# Patient Record
Sex: Female | Born: 1960 | Race: Black or African American | Hispanic: No | Marital: Married | Smoking: Former smoker
Health system: Southern US, Community
[De-identification: ages and names within clinical notes are randomized; demographics above are authoritative.]

## PROBLEM LIST (undated history)

## (undated) DIAGNOSIS — Z789 Other specified health status: Secondary | ICD-10-CM

## (undated) DIAGNOSIS — I10 Essential (primary) hypertension: Secondary | ICD-10-CM

## (undated) DIAGNOSIS — E559 Vitamin D deficiency, unspecified: Secondary | ICD-10-CM

## (undated) HISTORY — PX: MOUTH SURGERY: SHX715

## (undated) HISTORY — DX: Vitamin D deficiency, unspecified: E55.9

## (undated) HISTORY — DX: Essential (primary) hypertension: I10

---

## 2001-05-10 ENCOUNTER — Emergency Department (HOSPITAL_COMMUNITY): Admission: EM | Admit: 2001-05-10 | Discharge: 2001-05-10 | Payer: Self-pay | Admitting: *Deleted

## 2002-08-26 ENCOUNTER — Encounter: Payer: Self-pay | Admitting: Emergency Medicine

## 2002-08-26 ENCOUNTER — Emergency Department (HOSPITAL_COMMUNITY): Admission: EM | Admit: 2002-08-26 | Discharge: 2002-08-26 | Payer: Self-pay | Admitting: Emergency Medicine

## 2003-10-04 ENCOUNTER — Emergency Department (HOSPITAL_COMMUNITY): Admission: EM | Admit: 2003-10-04 | Discharge: 2003-10-05 | Payer: Self-pay | Admitting: *Deleted

## 2004-10-22 ENCOUNTER — Emergency Department (HOSPITAL_COMMUNITY): Admission: EM | Admit: 2004-10-22 | Discharge: 2004-10-22 | Payer: Self-pay | Admitting: Emergency Medicine

## 2006-01-28 ENCOUNTER — Emergency Department (HOSPITAL_COMMUNITY): Admission: EM | Admit: 2006-01-28 | Discharge: 2006-01-28 | Payer: Self-pay | Admitting: Family Medicine

## 2007-10-02 ENCOUNTER — Emergency Department (HOSPITAL_COMMUNITY): Admission: EM | Admit: 2007-10-02 | Discharge: 2007-10-02 | Payer: Self-pay | Admitting: Emergency Medicine

## 2009-03-24 ENCOUNTER — Emergency Department (HOSPITAL_COMMUNITY): Admission: EM | Admit: 2009-03-24 | Discharge: 2009-03-24 | Payer: Self-pay | Admitting: Family Medicine

## 2009-03-27 ENCOUNTER — Emergency Department (HOSPITAL_COMMUNITY): Admission: EM | Admit: 2009-03-27 | Discharge: 2009-03-27 | Payer: Self-pay | Admitting: Emergency Medicine

## 2010-10-08 LAB — URINALYSIS, ROUTINE W REFLEX MICROSCOPIC
Bilirubin Urine: NEGATIVE
Glucose, UA: NEGATIVE
Hgb urine dipstick: NEGATIVE
Protein, ur: NEGATIVE
Specific Gravity, Urine: 1.014
Urobilinogen, UA: 1
pH: 6.5

## 2010-10-08 LAB — WET PREP, GENITAL
Trich, Wet Prep: NONE SEEN
Yeast Wet Prep HPF POC: NONE SEEN

## 2010-10-08 LAB — PREGNANCY, URINE: Preg Test, Ur: NEGATIVE

## 2011-06-09 ENCOUNTER — Inpatient Hospital Stay (HOSPITAL_COMMUNITY)
Admission: AD | Admit: 2011-06-09 | Discharge: 2011-06-09 | Disposition: A | Discharge status: Home / Self Care | Payer: Self-pay | Source: Ambulatory Visit | Attending: Obstetrics and Gynecology | Admitting: Obstetrics and Gynecology

## 2011-06-09 ENCOUNTER — Encounter (HOSPITAL_COMMUNITY): Payer: Self-pay | Admitting: *Deleted

## 2011-06-09 DIAGNOSIS — N61 Mastitis without abscess: Secondary | ICD-10-CM | POA: Insufficient documentation

## 2011-06-09 DIAGNOSIS — IMO0002 Reserved for concepts with insufficient information to code with codable children: Secondary | ICD-10-CM

## 2011-06-09 DIAGNOSIS — L02419 Cutaneous abscess of limb, unspecified: Secondary | ICD-10-CM

## 2011-06-09 HISTORY — DX: Other specified health status: Z78.9

## 2011-06-09 MED ORDER — SULFAMETHOXAZOLE-TRIMETHOPRIM 800-160 MG PO TABS: 2.0000 | ORAL_TABLET | Freq: Two times a day (BID) | ORAL | Status: AC

## 2011-06-09 NOTE — MAU Provider Note (Signed)
  History     CSN: [id number]  Arrival date and time: 06/09/11 1348   First Provider Initiated Contact with Patient 06/09/11 1519      Chief Complaint  Patient presents with  . Breast Mass   HPI This is a 51 year old female who presents the MAU with a right axillary painful mass that started approximately 5 days ago. The patient noticed there is small lesion that grew in size and in pain over the past 3 or 4 days.  She has tried warm compresses which have been minimally helpful with her discomfort.  There has been no draining. She denies fevers, chills, nausea, vomiting. No provoking factors. She has had an episode of this about 10 years ago. She has not had any further episodes to  OB History    Grav Para Term Preterm Abortions TAB SAB Ect Mult Living   3 2 2  1  1   2       Past Medical History  Diagnosis Date  . No pertinent past medical history     Past Surgical History  Procedure Date  . Mouth surgery     Family History  Problem Relation Age of Onset  . Anesthesia problems Neg Hx   . Hypotension Neg Hx   . Malignant hyperthermia Neg Hx   . Pseudochol deficiency Neg Hx     History  Substance Use Topics  . Smoking status: Former Games developer  . Smokeless tobacco: Not on file  . Alcohol Use: No    Allergies: No Known Allergies  Prescriptions prior to admission  Medication Sig Dispense Refill  . albuterol (PROVENTIL HFA;VENTOLIN HFA) 108 (90 BASE) MCG/ACT inhaler Inhale 1 puff into the lungs every 6 (six) hours as needed. SOB        ROS Physical Exam   Blood pressure 148/81, pulse 117, temperature 98.3 F (36.8 C), temperature source Oral, resp. rate 18, height 5\' 3"  (1.6 m), weight 74.844 kg (165 lb).  Physical Exam  Constitutional: She is oriented to person, place, and time. She appears well-developed and well-nourished.  Neurological: She is alert and oriented to person, place, and time.  Skin: Skin is warm and dry.       Right axillary fluctuant abscess  approximately 5 cm in size.  Psychiatric: She has a normal mood and affect. Her behavior is normal. Judgment and thought content normal.    MAU Course  Procedures Incision and drainage of abscess. See note below.   Assessment and Plan  #1 right axillary abscess and cellulitis  Abscess drained as below. Place the patient on Bactrim with MRSA coverage. Culture sent. Instructions given to patient to slowly remove the packing. Patient to return to MAU in approximately 5 days as patient has no primary physician. Instructions given to patient to return if develops fevers, chills or area a significant drainage.   Procedure note:  Lidocaine 1% 10 ml was injected into the skin for local anesthetic. The flexure abscess was then incised and a large amount of purulent discharge was expressed from the abscess. Hemostats were used to break up loculations. Culture was obtained and sent to the lab. Ultimately 15 cm of half-inch packing ribbon was inserted into the abscess and the wound was covered using a pressure dressing.   Carri Spillers JEHIEL 06/09/2011, 4:10 PM

## 2011-06-09 NOTE — MAU Note (Signed)
C/o lump in R armpit for past 2 days;

## 2011-06-09 NOTE — Discharge Instructions (Signed)
Abscess An abscess (boil or furuncle) is an infected area under your skin. This area is filled with yellowish white fluid (pus). HOME CARE   Only take medicine as told by your doctor.   Keep the skin clean around your abscess. Keep clothes that may touch the abscess clean.   Change any bandages (dressings) as told by your doctor.   Avoid direct skin contact with other people. The infection can spread by skin contact with others.   Practice good hygiene and do not share personal care items.   Do not share athletic equipment, towels, or whirlpools. Shower after every practice or work out session.   If a draining area cannot be covered:   Do not play sports.   Children should not go to daycare until the wound has healed or until fluid (drainage) stops coming out of the wound.   See your doctor for a follow-up visit as told.  GET HELP RIGHT AWAY IF:   There is more pain, puffiness (swelling), and redness in the wound site.   There is fluid or bleeding from the wound site.   You have muscle aches, chills, fever, or feel sick.   You or your child has a temperature by mouth above 102 F (38.9 C), not controlled by medicine.   Your baby is older than 3 months with a rectal temperature of 102 F (38.9 C) or higher.  MAKE SURE YOU:   Understand these instructions.   Will watch your condition.   Will get help right away if you are not doing well or get worse.  Document Released: 06/12/2007 Document Revised: 12/13/2010 Document Reviewed: 06/12/2007 ExitCare Patient Information 2012 [employer]. 

## 2011-06-12 LAB — CULTURE, ROUTINE-ABSCESS: Special Requests: NORMAL

## 2011-09-23 ENCOUNTER — Ambulatory Visit: Payer: Self-pay | Admitting: Emergency Medicine

## 2012-08-25 ENCOUNTER — Other Ambulatory Visit: Payer: Self-pay

## 2012-08-25 DIAGNOSIS — Z1231 Encounter for screening mammogram for malignant neoplasm of breast: Secondary | ICD-10-CM

## 2012-09-21 ENCOUNTER — Ambulatory Visit
Admission: RE | Admit: 2012-09-21 | Discharge: 2012-09-21 | Disposition: A | Discharge status: Home / Self Care | Payer: 59 | Source: Ambulatory Visit

## 2012-09-21 DIAGNOSIS — Z1231 Encounter for screening mammogram for malignant neoplasm of breast: Secondary | ICD-10-CM

## 2012-09-23 ENCOUNTER — Other Ambulatory Visit: Payer: Self-pay | Admitting: Family

## 2012-09-23 DIAGNOSIS — R928 Other abnormal and inconclusive findings on diagnostic imaging of breast: Secondary | ICD-10-CM

## 2012-10-08 ENCOUNTER — Ambulatory Visit
Admission: RE | Admit: 2012-10-08 | Discharge: 2012-10-08 | Disposition: A | Discharge status: Home / Self Care | Payer: 59 | Source: Ambulatory Visit | Attending: Family | Admitting: Family

## 2012-10-08 DIAGNOSIS — R928 Other abnormal and inconclusive findings on diagnostic imaging of breast: Secondary | ICD-10-CM

## 2013-11-08 ENCOUNTER — Encounter (HOSPITAL_COMMUNITY): Payer: Self-pay | Admitting: *Deleted

## 2014-06-03 ENCOUNTER — Ambulatory Visit
Admission: RE | Admit: 2014-06-03 | Discharge: 2014-06-03 | Disposition: A | Discharge status: Home / Self Care | Payer: 59 | Source: Ambulatory Visit | Attending: Family | Admitting: Family

## 2014-06-03 ENCOUNTER — Other Ambulatory Visit: Payer: Self-pay | Admitting: Family

## 2014-06-03 DIAGNOSIS — M25532 Pain in left wrist: Secondary | ICD-10-CM

## 2015-03-21 DIAGNOSIS — M65341 Trigger finger, right ring finger: Secondary | ICD-10-CM | POA: Diagnosis not present

## 2015-03-21 DIAGNOSIS — M65342 Trigger finger, left ring finger: Secondary | ICD-10-CM | POA: Diagnosis not present

## 2015-03-21 DIAGNOSIS — M654 Radial styloid tenosynovitis [de Quervain]: Secondary | ICD-10-CM | POA: Diagnosis not present

## 2015-04-28 ENCOUNTER — Other Ambulatory Visit: Payer: Self-pay | Admitting: Orthopedic Surgery

## 2015-06-30 ENCOUNTER — Encounter (HOSPITAL_BASED_OUTPATIENT_CLINIC_OR_DEPARTMENT_OTHER): Payer: Self-pay

## 2015-06-30 ENCOUNTER — Ambulatory Visit (HOSPITAL_BASED_OUTPATIENT_CLINIC_OR_DEPARTMENT_OTHER): Admit: 2015-06-30 | Payer: 59 | Admitting: Orthopedic Surgery

## 2015-06-30 SURGERY — MINOR RELEASE TRIGGER FINGER/A-1 PULLEY
Anesthesia: LOCAL | Laterality: Bilateral

## 2015-09-20 DIAGNOSIS — Z79899 Other long term (current) drug therapy: Secondary | ICD-10-CM | POA: Diagnosis not present

## 2015-09-20 DIAGNOSIS — M25579 Pain in unspecified ankle and joints of unspecified foot: Secondary | ICD-10-CM | POA: Diagnosis not present

## 2015-09-20 DIAGNOSIS — M180 Bilateral primary osteoarthritis of first carpometacarpal joints: Secondary | ICD-10-CM | POA: Diagnosis not present

## 2015-09-20 DIAGNOSIS — M25571 Pain in right ankle and joints of right foot: Secondary | ICD-10-CM | POA: Diagnosis not present

## 2016-08-15 ENCOUNTER — Other Ambulatory Visit: Payer: Self-pay | Admitting: Family

## 2016-08-15 DIAGNOSIS — Z1231 Encounter for screening mammogram for malignant neoplasm of breast: Secondary | ICD-10-CM

## 2016-08-20 ENCOUNTER — Ambulatory Visit
Admission: RE | Admit: 2016-08-20 | Discharge: 2016-08-20 | Disposition: A | Discharge status: Home / Self Care | Payer: 59 | Source: Ambulatory Visit | Attending: Family | Admitting: Family

## 2016-08-20 DIAGNOSIS — Z1231 Encounter for screening mammogram for malignant neoplasm of breast: Secondary | ICD-10-CM

## 2016-09-04 DIAGNOSIS — R202 Paresthesia of skin: Secondary | ICD-10-CM | POA: Diagnosis not present

## 2016-09-04 DIAGNOSIS — Z79899 Other long term (current) drug therapy: Secondary | ICD-10-CM | POA: Diagnosis not present

## 2016-09-04 DIAGNOSIS — M180 Bilateral primary osteoarthritis of first carpometacarpal joints: Secondary | ICD-10-CM | POA: Diagnosis not present

## 2016-09-04 DIAGNOSIS — L03031 Cellulitis of right toe: Secondary | ICD-10-CM | POA: Diagnosis not present

## 2017-03-13 DIAGNOSIS — E559 Vitamin D deficiency, unspecified: Secondary | ICD-10-CM | POA: Diagnosis not present

## 2017-03-13 DIAGNOSIS — Z0000 Encounter for general adult medical examination without abnormal findings: Secondary | ICD-10-CM | POA: Diagnosis not present

## 2017-03-13 DIAGNOSIS — M06332 Rheumatoid nodule, left wrist: Secondary | ICD-10-CM | POA: Diagnosis not present

## 2018-02-23 ENCOUNTER — Other Ambulatory Visit: Payer: Self-pay | Admitting: Family

## 2018-02-23 DIAGNOSIS — Z1231 Encounter for screening mammogram for malignant neoplasm of breast: Secondary | ICD-10-CM

## 2018-03-23 ENCOUNTER — Other Ambulatory Visit: Payer: Self-pay

## 2018-03-23 ENCOUNTER — Ambulatory Visit
Admission: RE | Admit: 2018-03-23 | Discharge: 2018-03-23 | Disposition: A | Discharge status: Home / Self Care | Payer: BC Managed Care – PPO | Source: Ambulatory Visit | Attending: Family | Admitting: Family

## 2018-03-23 DIAGNOSIS — Z1231 Encounter for screening mammogram for malignant neoplasm of breast: Secondary | ICD-10-CM

## 2019-03-19 ENCOUNTER — Other Ambulatory Visit: Payer: Self-pay | Admitting: Family

## 2019-03-19 DIAGNOSIS — Z1231 Encounter for screening mammogram for malignant neoplasm of breast: Secondary | ICD-10-CM

## 2019-04-06 ENCOUNTER — Ambulatory Visit
Admission: RE | Admit: 2019-04-06 | Discharge: 2019-04-06 | Disposition: A | Discharge status: Home / Self Care | Payer: BC Managed Care – PPO | Source: Ambulatory Visit | Attending: Family | Admitting: Family

## 2019-04-06 ENCOUNTER — Other Ambulatory Visit: Payer: Self-pay

## 2019-04-06 DIAGNOSIS — Z1231 Encounter for screening mammogram for malignant neoplasm of breast: Secondary | ICD-10-CM

## 2019-04-19 ENCOUNTER — Encounter: Payer: Self-pay | Admitting: Obstetrics and Gynecology

## 2019-04-26 ENCOUNTER — Other Ambulatory Visit: Payer: Self-pay

## 2019-04-27 ENCOUNTER — Encounter: Payer: Self-pay | Admitting: Obstetrics and Gynecology

## 2019-04-27 ENCOUNTER — Ambulatory Visit: Payer: BC Managed Care – PPO | Admitting: Obstetrics and Gynecology

## 2019-04-27 VITALS — BP 130/64 | HR 97 | Temp 97.5°F | Ht 63.0 in | Wt 178.0 lb

## 2019-04-27 DIAGNOSIS — Z23 Encounter for immunization: Secondary | ICD-10-CM | POA: Diagnosis not present

## 2019-04-27 DIAGNOSIS — Z01419 Encounter for gynecological examination (general) (routine) without abnormal findings: Secondary | ICD-10-CM | POA: Diagnosis not present

## 2019-04-27 DIAGNOSIS — Z124 Encounter for screening for malignant neoplasm of cervix: Secondary | ICD-10-CM

## 2019-04-27 DIAGNOSIS — Z0000 Encounter for general adult medical examination without abnormal findings: Secondary | ICD-10-CM | POA: Diagnosis not present

## 2019-04-27 DIAGNOSIS — E559 Vitamin D deficiency, unspecified: Secondary | ICD-10-CM | POA: Diagnosis not present

## 2019-04-27 DIAGNOSIS — Z6831 Body mass index (BMI) 31.0-31.9, adult: Secondary | ICD-10-CM

## 2019-04-27 DIAGNOSIS — Z833 Family history of diabetes mellitus: Secondary | ICD-10-CM

## 2019-04-27 NOTE — Progress Notes (Signed)
59 y.o. [id number] Married Black or [place] American Not Hispanic or Latino female here for annual exam and to establish care. No vaginal bleeding. Sexually active, occasional deep dyspareunia.     Patient's last menstrual period was 09/04/2012.          Sexually active: Yes.    The current method of family planning is post menopausal status.    Exercising: Yes.    Walking and Cardio  Smoker:  no  Health Maintenance: Pap:  2019 normal  History of abnormal Pap:  no MMG:  04/06/19 Bi-rads 1 neg  BMD:   Na  Colonoscopy: about 5 years ago and is due for one this year, Dr Loreta Ave  TDaP:  Unsure  Gardasil: no    reports that she has quit smoking. She does not have any smokeless tobacco history on file. She reports that she does not drink alcohol or use drugs.Rare ETOH. 2 grown daughters, local, 5 grandchildren (oldest 38, youngest is 6). 3 sisters and 2 brothers. She works in Stage manager at [employer].   Past Medical History:  Diagnosis Date  . No pertinent past medical history   . Vitamin D deficiency     Past Surgical History:  Procedure Laterality Date  . MOUTH SURGERY      No current outpatient medications on file.   No current facility-administered medications for this visit.    Family History  Problem Relation Age of Onset  . Diabetes Mother   . Heart failure Mother   . Hypertension Mother   . Asthma Sister   . Hypertension Sister   . Diabetes Brother   . Hypertension Brother   . Cancer Paternal Aunt   . Diabetes Paternal Aunt   . Diabetes Maternal Grandmother   . Cancer Paternal Grandmother   . Pseudochol deficiency Neg Hx   . Malignant hyperthermia Neg Hx   . Hypotension Neg Hx   . Thyroid disease Neg Hx   . Anesthesia problems Neg Hx     Review of Systems  All other systems reviewed and are negative.   Exam:   BP 130/64   Pulse 97   Temp (!) 97.5 F (36.4 C)   Ht 5\' 3"  (1.6 m)   Wt 178 lb (80.7 kg)   LMP 09/04/2012   SpO2 96%   BMI 31.53 kg/m   Weight  change: @WEIGHTCHANGE @ Height:   Height: 5\' 3"  (160 cm)  Ht Readings from Last 3 Encounters:  04/27/19 5\' 3"  (1.6 m)  06/09/11 5\' 3"  (1.6 m)    General appearance: alert, cooperative and appears stated age Head: Normocephalic, without obvious abnormality, atraumatic Neck: no adenopathy, supple, symmetrical, trachea midline and thyroid normal to inspection and palpation Lungs: clear to auscultation bilaterally Cardiovascular: regular rate and rhythm Breasts: normal appearance, no masses or tenderness Abdomen: soft, non-tender; non distended,  no masses,  no organomegaly Extremities: extremities normal, atraumatic, no cyanosis or edema Skin: Skin color, texture, turgor normal. No rashes or lesions Lymph nodes: Cervical, supraclavicular, and axillary nodes normal. No abnormal inguinal nodes palpated Neurologic: Grossly normal   Pelvic: External genitalia:  no lesions              Urethra:  normal appearing urethra with no masses, tenderness or lesions              Bartholins and Skenes: normal                 Vagina: mildly atrophic appearing vagina with normal color and  discharge, no lesions              Cervix: no lesions               Bimanual Exam:  Uterus:  normal size, contour, position, consistency, mobility, non-tender              Adnexa: no mass, fullness, tenderness               Rectovaginal: Confirms               Anus:  normal sphincter tone, no lesions  Gae Dry chaperoned for the exam.  A:  Well Woman with normal exam  FH DM  Vit d def  BMI 31  P:   Pap with hpv  Screening labs, TSH, HgbA1C, vit D  TDAP  Mammogram UTD  Discussed breast self exam  Discussed calcium and vit D intake

## 2019-04-27 NOTE — Patient Instructions (Signed)

## 2019-04-28 ENCOUNTER — Other Ambulatory Visit (HOSPITAL_COMMUNITY)
Admission: RE | Admit: 2019-04-28 | Discharge: 2019-04-28 | Disposition: A | Discharge status: Home / Self Care | Payer: BC Managed Care – PPO | Source: Ambulatory Visit | Attending: Obstetrics and Gynecology | Admitting: Obstetrics and Gynecology

## 2019-04-28 DIAGNOSIS — Z124 Encounter for screening for malignant neoplasm of cervix: Secondary | ICD-10-CM | POA: Diagnosis present

## 2019-04-28 LAB — COMPREHENSIVE METABOLIC PANEL
ALT: 12 IU/L (ref 0–32)
AST: 17 IU/L (ref 0–40)
Albumin/Globulin Ratio: 1.7 (ref 1.2–2.2)
Albumin: 4.3 g/dL (ref 3.8–4.9)
Alkaline Phosphatase: 85 IU/L (ref 39–117)
BUN/Creatinine Ratio: 17 (ref 9–23)
BUN: 13 mg/dL (ref 6–24)
Bilirubin Total: 0.3 mg/dL (ref 0.0–1.2)
CO2: 22 mmol/L (ref 20–29)
Calcium: 9.1 mg/dL (ref 8.7–10.2)
Chloride: 110 mmol/L — ABNORMAL HIGH (ref 96–106)
Creatinine, Ser: 0.77 mg/dL (ref 0.57–1.00)
GFR calc Af Amer: 100 mL/min/{1.73_m2} (ref >59)
GFR calc non Af Amer: 87 mL/min/{1.73_m2} (ref >59)
Globulin, Total: 2.5 g/dL (ref 1.5–4.5)
Glucose: 95 mg/dL (ref 65–99)
Potassium: 3.9 mmol/L (ref 3.5–5.2)
Sodium: 146 mmol/L — ABNORMAL HIGH (ref 134–144)
Total Protein: 6.8 g/dL (ref 6.0–8.5)

## 2019-04-28 LAB — LIPID PANEL
Chol/HDL Ratio: 2.5 ratio (ref 0.0–4.4)
Cholesterol, Total: 144 mg/dL (ref 100–199)
HDL: 58 mg/dL (ref >39)
LDL Chol Calc (NIH): 72 mg/dL (ref 0–99)
Triglycerides: 72 mg/dL (ref 0–149)
VLDL Cholesterol Cal: 14 mg/dL (ref 5–40)

## 2019-04-28 LAB — CBC
Hematocrit: 41.5 % (ref 34.0–46.6)
Hemoglobin: 13.4 g/dL (ref 11.1–15.9)
MCH: 26.7 pg (ref 26.6–33.0)
MCHC: 32.3 g/dL (ref 31.5–35.7)
MCV: 83 fL (ref 79–97)
Platelets: 183 10*3/uL (ref 150–450)
RBC: 5.02 x10E6/uL (ref 3.77–5.28)
RDW: 14 % (ref 11.7–15.4)
WBC: 4.2 10*3/uL (ref 3.4–10.8)

## 2019-04-28 LAB — VITAMIN D 25 HYDROXY (VIT D DEFICIENCY, FRACTURES): Vit D, 25-Hydroxy: 30.7 ng/mL (ref 30.0–100.0)

## 2019-04-28 LAB — HEMOGLOBIN A1C
Est. average glucose Bld gHb Est-mCnc: 120 mg/dL
Hgb A1c MFr Bld: 5.8 % — ABNORMAL HIGH (ref 4.8–5.6)

## 2019-04-28 LAB — TSH: TSH: 0.755 u[IU]/mL (ref 0.450–4.500)

## 2019-04-28 NOTE — Addendum Note (Signed)
Addended by: Tobi Bastos on: 04/28/2019 04:14 PM   Modules accepted: Orders

## 2019-04-30 LAB — CYTOLOGY - PAP
Comment: NEGATIVE
Diagnosis: NEGATIVE
High risk HPV: NEGATIVE

## 2019-06-09 ENCOUNTER — Encounter: Payer: Self-pay | Admitting: Obstetrics and Gynecology

## 2020-03-29 ENCOUNTER — Other Ambulatory Visit: Payer: Self-pay | Admitting: Internal Medicine

## 2020-03-29 DIAGNOSIS — Z1231 Encounter for screening mammogram for malignant neoplasm of breast: Secondary | ICD-10-CM

## 2020-04-16 ENCOUNTER — Ambulatory Visit (HOSPITAL_COMMUNITY)
Admission: EM | Admit: 2020-04-16 | Discharge: 2020-04-16 | Disposition: A | Discharge status: Home / Self Care | Payer: BC Managed Care – PPO | Attending: Family Medicine | Admitting: Family Medicine

## 2020-04-16 ENCOUNTER — Encounter (HOSPITAL_COMMUNITY): Payer: Self-pay | Admitting: *Deleted

## 2020-04-16 ENCOUNTER — Other Ambulatory Visit: Payer: Self-pay

## 2020-04-16 DIAGNOSIS — B029 Zoster without complications: Secondary | ICD-10-CM

## 2020-04-16 MED ORDER — PREDNISONE 20 MG PO TABS: 40.0000 mg | ORAL_TABLET | Freq: Every day | ORAL | 0 refills | Status: DC

## 2020-04-16 MED ORDER — VALACYCLOVIR HCL 1 G PO TABS: 1000.0000 mg | ORAL_TABLET | Freq: Three times a day (TID) | ORAL | 0 refills | Status: AC

## 2020-04-16 NOTE — ED Provider Notes (Signed)
[facility]    CSN: [id number] Arrival date & time: 04/16/20  1155      History   Chief Complaint Chief Complaint  Patient presents with  . Rash    HPI Cindy Wilson is a 60 y.o. female.   Patient presented with 3-day history of painful, itchy rash to left lower abdomen.  She states she now also has a few patches on the left mid back in a line toward spine.  Denies new products or exposures, new medications, recent travel, history of similar rashes or chronic skin conditions, fever, chills, body aches.  Has not tried anything over-the-counter for symptoms.     Past Medical History:  Diagnosis Date  . Vitamin D deficiency     Patient Active Problem List   Diagnosis Date Noted  . Vitamin D deficiency     Past Surgical History:  Procedure Laterality Date  . MOUTH SURGERY      OB History    Gravida  3   Para  2   Term  2   Preterm      AB  1   Living  2     SAB  1   IAB      Ectopic      Multiple      Live Births               Home Medications    Prior to Admission medications   Medication Sig Start Date End Date Taking? Authorizing Provider  predniSONE (DELTASONE) 20 MG tablet Take 2 tablets (40 mg total) by mouth daily with breakfast. 04/16/20  Yes Particia Nearing, PA-C  valACYclovir (VALTREX) 1000 MG tablet Take 1 tablet (1,000 mg total) by mouth 3 (three) times daily for 7 days. 04/16/20 04/23/20 Yes Particia Nearing, PA-C    Family History Family History  Problem Relation Age of Onset  . Diabetes Mother   . Heart failure Mother   . Hypertension Mother   . Asthma Sister   . Hypertension Sister   . Diabetes Brother   . Hypertension Brother   . Cancer Paternal Aunt   . Diabetes Paternal Aunt   . Diabetes Maternal Grandmother   . Cancer Paternal Grandmother   . Pseudochol deficiency Neg Hx   . Malignant hyperthermia Neg Hx   . Hypotension Neg Hx   . Thyroid disease Neg Hx   . Anesthesia problems Neg Hx      Social History Social History   Tobacco Use  . Smoking status: Former Games developer  . Smokeless tobacco: Never Used  Vaping Use  . Vaping Use: Never used  Substance Use Topics  . Alcohol use: Yes    Comment: occasionally  . Drug use: No     Allergies   Patient has no known allergies.   Review of Systems Review of Systems Per HPI  Physical Exam Triage Vital Signs ED Triage Vitals  Enc Vitals Group     BP 04/16/20 1332 (!) 148/93     Pulse Rate 04/16/20 1332 100     Resp 04/16/20 1332 14     Temp 04/16/20 1332 (!) 97.2 F (36.2 C)     Temp Source 04/16/20 1332 Temporal     SpO2 04/16/20 1332 96 %     Weight --      Height --      Head Circumference --      Peak Flow --      Pain Score 04/16/20 1333  6     Pain Loc --      Pain Edu? --      Excl. in GC? --    No data found.  Updated Vital Signs BP (!) 148/93   Pulse 100   Temp (!) 97.2 F (36.2 C) (Temporal)   Resp 14   LMP 09/04/2012   SpO2 96%   Visual Acuity Right Eye Distance:   Left Eye Distance:   Bilateral Distance:    Right Eye Near:   Left Eye Near:    Bilateral Near:     Physical Exam Vitals and nursing note reviewed.  Constitutional:      Appearance: Normal appearance. She is not ill-appearing.  HENT:     Head: Atraumatic.     Mouth/Throat:     Mouth: Mucous membranes are moist.     Pharynx: Oropharynx is clear.  Eyes:     Extraocular Movements: Extraocular movements intact.     Conjunctiva/sclera: Conjunctivae normal.  Cardiovascular:     Rate and Rhythm: Normal rate and regular rhythm.     Heart sounds: Normal heart sounds.  Pulmonary:     Effort: Pulmonary effort is normal.     Breath sounds: Normal breath sounds.  Musculoskeletal:        General: Normal range of motion.     Cervical back: Normal range of motion and neck supple.  Skin:    General: Skin is warm and dry.     Findings: Rash present.     Comments: Erythematous vesicular rash in a linear distribution on left  lower abdomen extending around left flank.  Tender to palpation.  Neurological:     Mental Status: She is alert and oriented to person, place, and time.  Psychiatric:        Mood and Affect: Mood normal.        Thought Content: Thought content normal.        Judgment: Judgment normal.      UC Treatments / Results  Labs (all labs ordered are listed, but only abnormal results are displayed) Labs Reviewed - No data to display  EKG   Radiology No results found.  Procedures Procedures (including critical care time)  Medications Ordered in UC Medications - No data to display  Initial Impression / Assessment and Plan / UC Course  I have reviewed the triage vital signs and the nursing notes.  Pertinent labs & imaging results that were available during my care of the patient were reviewed by me and considered in my medical decision making (see chart for details).     Consistent with shingles rash.  Will treat with prednisone burst, Valtrex, keep covered.  Isolation precautions reviewed.  Follow-up for worsening symptoms.  Final Clinical Impressions(s) / UC Diagnoses   Final diagnoses:  Herpes zoster without complication   Discharge Instructions   None    ED Prescriptions    Medication Sig Dispense Auth. Provider   valACYclovir (VALTREX) 1000 MG tablet Take 1 tablet (1,000 mg total) by mouth 3 (three) times daily for 7 days. 21 tablet Particia Nearing, [place]   predniSONE (DELTASONE) 20 MG tablet Take 2 tablets (40 mg total) by mouth daily with breakfast. 10 tablet Particia Nearing, [place]     PDMP not reviewed this encounter.   Particia Nearing, [place] 04/16/20 [id number]

## 2020-04-16 NOTE — ED Triage Notes (Signed)
C/O pruritic, uncomfortable rash to left abd and left flank area; states rash does not appear to be spreading.

## 2020-04-25 NOTE — Progress Notes (Signed)
60 y.o. [id number] Married Black or [place] American Not Hispanic or Latino female here for annual exam.  No vaginal bleeding. No dyspareunia. Infrequently sexually active secondary to ED. Husband hasn't been comfortable talking to his provider about the ED.  No bowel or bladder changes.    Patient's last menstrual period was 09/04/2012.          Sexually active: Yes.    The current method of family planning is post menopausal status.    Exercising: Yes.    walking  Smoker:  no  Health Maintenance: Pap:4/21/21WNL Hr HPV Neg,  2019 normal  History of abnormal Pap:  no MMG:  04/07/19 density B Bi-rads 1 neg, has an appointment in May  BMD:   None  Colonoscopy: 06/09/19 polyp f/u 5 years  TDaP:  04/27/19  Gardasil: na    reports that she has quit smoking. She has never used smokeless tobacco. She reports current alcohol use. She reports that she does not use drugs. Just occasional ETOH. 2 grown daughters, local, 5 grandchildren (oldest 60, youngest is 7). 3 sisters and 2 brothers. She works in Stage manager at [employer].    Past Medical History:  Diagnosis Date  . Vitamin D deficiency     Past Surgical History:  Procedure Laterality Date  . MOUTH SURGERY      No current outpatient medications on file.   No current facility-administered medications for this visit.    Family History  Problem Relation Age of Onset  . Diabetes Mother   . Heart failure Mother   . Hypertension Mother   . Asthma Sister   . Hypertension Sister   . Diabetes Brother   . Hypertension Brother   . Cancer Paternal Aunt   . Diabetes Paternal Aunt   . Diabetes Maternal Grandmother   . Cancer Paternal Grandmother   . Pseudochol deficiency Neg Hx   . Malignant hyperthermia Neg Hx   . Hypotension Neg Hx   . Thyroid disease Neg Hx   . Anesthesia problems Neg Hx     Review of Systems  All other systems reviewed and are negative.   Exam:   BP 140/82   Pulse 95   Ht 5\' 3"  (1.6 m)   Wt 177 lb (80.3 kg)    LMP 09/04/2012   SpO2 98%   BMI 31.35 kg/m   Weight change: @WEIGHTCHANGE @ Height:   Height: 5\' 3"  (160 cm)  Ht Readings from Last 3 Encounters:  04/27/20 5\' 3"  (1.6 m)  04/27/19 5\' 3"  (1.6 m)  06/09/11 5\' 3"  (1.6 m)    General appearance: alert, cooperative and appears stated age Head: Normocephalic, without obvious abnormality, atraumatic Neck: no adenopathy, supple, symmetrical, trachea midline and thyroid normal to inspection and palpation Lungs: clear to auscultation bilaterally Cardiovascular: regular rate and rhythm Breasts: normal appearance, no masses or tenderness Abdomen: soft, non-tender; non distended,  no masses,  no organomegaly Extremities: extremities normal, atraumatic, no cyanosis or edema Skin: Skin color, texture, turgor normal. No rashes or lesions Lymph nodes: Cervical, supraclavicular, and axillary nodes normal. No abnormal inguinal nodes palpated Neurologic: Grossly normal   Pelvic: External genitalia:  no lesions              Urethra:  normal appearing urethra with no masses, tenderness or lesions              Bartholins and Skenes: normal                 Vagina: atrophic  appearing vagina with normal color and discharge, no lesions              Cervix: no lesions               Bimanual Exam:  Uterus:  normal size, contour, position, consistency, mobility, non-tender              Adnexa: no mass, fullness, tenderness               Rectovaginal: Confirms               Anus:  normal sphincter tone, no lesions  Cindy Wilson chaperoned for the exam.  1. Well woman exam Discussed breast self exam Discussed calcium and vit D intake Mammogram scheduled Colonoscopy UTD  2. Prediabetes - Hemoglobin A1c  3. Vitamin D deficiency Taking vit d - VITAMIN D 25 Hydroxy (Vit-D Deficiency, Fractures)  4. Laboratory exam ordered as part of routine general medical examination - CBC - Comprehensive metabolic panel - Lipid panel  5. BMI  31.0-31.9,adult Discussed exercise and eating healthy

## 2020-04-27 ENCOUNTER — Ambulatory Visit (INDEPENDENT_AMBULATORY_CARE_PROVIDER_SITE_OTHER): Payer: BC Managed Care – PPO | Admitting: Obstetrics and Gynecology

## 2020-04-27 ENCOUNTER — Encounter: Payer: Self-pay | Admitting: Obstetrics and Gynecology

## 2020-04-27 ENCOUNTER — Other Ambulatory Visit: Payer: Self-pay

## 2020-04-27 VITALS — BP 140/82 | HR 95 | Ht 63.0 in | Wt 177.0 lb

## 2020-04-27 DIAGNOSIS — E559 Vitamin D deficiency, unspecified: Secondary | ICD-10-CM | POA: Diagnosis not present

## 2020-04-27 DIAGNOSIS — R7303 Prediabetes: Secondary | ICD-10-CM

## 2020-04-27 DIAGNOSIS — Z6831 Body mass index (BMI) 31.0-31.9, adult: Secondary | ICD-10-CM

## 2020-04-27 DIAGNOSIS — Z01419 Encounter for gynecological examination (general) (routine) without abnormal findings: Secondary | ICD-10-CM | POA: Diagnosis not present

## 2020-04-27 DIAGNOSIS — Z0000 Encounter for general adult medical examination without abnormal findings: Secondary | ICD-10-CM | POA: Diagnosis not present

## 2020-04-27 NOTE — Patient Instructions (Signed)

## 2020-04-28 LAB — CBC
HCT: 41.6 % (ref 35.0–45.0)
Hemoglobin: 13.3 g/dL (ref 11.7–15.5)
MCH: 26.2 pg — ABNORMAL LOW (ref 27.0–33.0)
MCHC: 32 g/dL (ref 32.0–36.0)
MCV: 81.9 fL (ref 80.0–100.0)
MPV: 11.5 fL (ref 7.5–12.5)
Platelets: 186 10*3/uL (ref 140–400)
RBC: 5.08 10*6/uL (ref 3.80–5.10)
RDW: 14.1 % (ref 11.0–15.0)
WBC: 4.4 10*3/uL (ref 3.8–10.8)

## 2020-04-28 LAB — LIPID PANEL
Cholesterol: 160 mg/dL (ref <200)
HDL: 62 mg/dL (ref >=50)
LDL Cholesterol (Calc): 79 mg/dL (calc)
Non-HDL Cholesterol (Calc): 98 mg/dL (calc) (ref <130)
Total CHOL/HDL Ratio: 2.6 (calc) (ref <5.0)
Triglycerides: 103 mg/dL (ref <150)

## 2020-04-28 LAB — COMPREHENSIVE METABOLIC PANEL
AG Ratio: 1.7 (calc) (ref 1.0–2.5)
ALT: 19 U/L (ref 6–29)
AST: 13 U/L (ref 10–35)
Albumin: 3.9 g/dL (ref 3.6–5.1)
Alkaline phosphatase (APISO): 69 U/L (ref 37–153)
BUN: 14 mg/dL (ref 7–25)
CO2: 27 mmol/L (ref 20–32)
Calcium: 8.8 mg/dL (ref 8.6–10.4)
Chloride: 110 mmol/L (ref 98–110)
Creat: 0.72 mg/dL (ref 0.50–1.05)
Globulin: 2.3 g/dL (calc) (ref 1.9–3.7)
Glucose, Bld: 83 mg/dL (ref 65–99)
Potassium: 3.8 mmol/L (ref 3.5–5.3)
Sodium: 143 mmol/L (ref 135–146)
Total Bilirubin: 0.4 mg/dL (ref 0.2–1.2)
Total Protein: 6.2 g/dL (ref 6.1–8.1)

## 2020-04-28 LAB — HEMOGLOBIN A1C
Hgb A1c MFr Bld: 5.8 % of total Hgb — ABNORMAL HIGH (ref <5.7)
Mean Plasma Glucose: 120 mg/dL
eAG (mmol/L): 6.6 mmol/L

## 2020-04-28 LAB — VITAMIN D 25 HYDROXY (VIT D DEFICIENCY, FRACTURES): Vit D, 25-Hydroxy: 32 ng/mL (ref 30–100)

## 2020-05-22 ENCOUNTER — Other Ambulatory Visit: Payer: Self-pay

## 2020-05-22 ENCOUNTER — Ambulatory Visit
Admission: RE | Admit: 2020-05-22 | Discharge: 2020-05-22 | Disposition: A | Discharge status: Home / Self Care | Payer: BC Managed Care – PPO | Source: Ambulatory Visit | Attending: Internal Medicine | Admitting: Internal Medicine

## 2020-05-22 DIAGNOSIS — Z1231 Encounter for screening mammogram for malignant neoplasm of breast: Secondary | ICD-10-CM

## 2020-10-15 ENCOUNTER — Other Ambulatory Visit: Payer: Self-pay

## 2020-10-15 ENCOUNTER — Emergency Department (HOSPITAL_BASED_OUTPATIENT_CLINIC_OR_DEPARTMENT_OTHER)
Admission: EM | Admit: 2020-10-15 | Discharge: 2020-10-15 | Disposition: A | Discharge status: Home / Self Care | Payer: BC Managed Care – PPO | Attending: Student | Admitting: Student

## 2020-10-15 ENCOUNTER — Encounter (HOSPITAL_BASED_OUTPATIENT_CLINIC_OR_DEPARTMENT_OTHER): Payer: Self-pay | Admitting: Emergency Medicine

## 2020-10-15 DIAGNOSIS — R21 Rash and other nonspecific skin eruption: Secondary | ICD-10-CM | POA: Diagnosis present

## 2020-10-15 DIAGNOSIS — L249 Irritant contact dermatitis, unspecified cause: Secondary | ICD-10-CM | POA: Diagnosis not present

## 2020-10-15 DIAGNOSIS — Z87891 Personal history of nicotine dependence: Secondary | ICD-10-CM | POA: Insufficient documentation

## 2020-10-15 MED ORDER — DIPHENHYDRAMINE HCL 25 MG PO TABS: 25.0000 mg | ORAL_TABLET | Freq: Four times a day (QID) | ORAL | 0 refills | Status: AC | PRN

## 2020-10-15 MED ORDER — PREDNISONE 20 MG PO TABS: ORAL_TABLET | ORAL | 0 refills | Status: DC

## 2020-10-15 NOTE — ED Provider Notes (Signed)
[facility] EMERGENCY DEPT Provider Note   CSN: [id number] Arrival date & time: 10/15/20  1040     History Chief Complaint  Patient presents with   Rash    Cindy Wilson is a 60 y.o. female.  The history is provided by the patient. No language interpreter was used.  Rash Associated symptoms: no fever and no shortness of breath    60 year old female who presents for evaluation of a rash.  Patient report 5 days ago she was outside cutting trees and bushes and the next day she noticed multiple area of redness and itchiness that appears on her right forearm and some on the left upper arm.  Fortunately little blisters which has since ruptured however she still endorse quite a bit of itchiness to the affected area.  She not recall anything specifically bit her.  She denies fever, throat swelling, lip swelling, trouble breathing, wheezing or abdominal cramping.  She tries taking Benadryl at home with some improvement.  She describes her symptoms as moderate in severity.  Past Medical History:  Diagnosis Date   Vitamin D deficiency     Patient Active Problem List   Diagnosis Date Noted   Prediabetes 04/27/2020   Vitamin D deficiency     Past Surgical History:  Procedure Laterality Date   MOUTH SURGERY       OB History     Gravida  3   Para  2   Term  2   Preterm      AB  1   Living  2      SAB  1   IAB      Ectopic      Multiple      Live Births              Family History  Problem Relation Age of Onset   Diabetes Mother    Heart failure Mother    Hypertension Mother    Asthma Sister    Hypertension Sister    Diabetes Brother    Hypertension Brother    Cancer Paternal Aunt    Diabetes Paternal Aunt    Diabetes Maternal Grandmother    Cancer Paternal Grandmother    Pseudochol deficiency Neg Hx    Malignant hyperthermia Neg Hx    Hypotension Neg Hx    Thyroid disease Neg Hx    Anesthesia problems Neg Hx     Social History    Tobacco Use   Smoking status: Former   Smokeless tobacco: Never  Building services engineer Use: Never used  Substance Use Topics   Alcohol use: Yes    Comment: occasionally   Drug use: No    Home Medications Prior to Admission medications   Not on File    Allergies    Patient has no known allergies.  Review of Systems   Review of Systems  Constitutional:  Negative for fever.  Respiratory:  Negative for shortness of breath.   Skin:  Positive for rash.  All other systems reviewed and are negative.  Physical Exam Updated Vital Signs BP (!) 145/100 (BP Location: Right Arm)   Pulse 94   Temp 97.7 F (36.5 C) (Temporal)   Resp 14   Ht 5\' 3"  (1.6 m)   Wt 78.5 kg   LMP 09/04/2012   SpO2 98%   BMI 30.65 kg/m   Physical Exam Vitals and nursing note reviewed.  Constitutional:      General: She is not in acute  distress.    Appearance: She is well-developed.  HENT:     Head: Atraumatic.  Eyes:     Conjunctiva/sclera: Conjunctivae normal.  Pulmonary:     Effort: Pulmonary effort is normal.  Musculoskeletal:     Cervical back: Neck supple.  Skin:    Findings: Rash (Right arm: Multiple  slightly raised erythematous patches throughout forearm with small deroofed vesicles noted to volar aspect of forearm with escoriation marks. one similar lesion to L upper arm) present.  Neurological:     Mental Status: She is alert.  Psychiatric:        Mood and Affect: Mood normal.    ED Results / Procedures / Treatments   Labs (all labs ordered are listed, but only abnormal results are displayed) Labs Reviewed - No data to display  EKG None  Radiology No results found.  Procedures Procedures   Medications Ordered in ED Medications - No data to display  ED Course  I have reviewed the triage vital signs and the nursing notes.  Pertinent labs & imaging results that were available during my care of the patient were reviewed by me and considered in my medical decision  making (see chart for details).    MDM Rules/Calculators/A&P                           BP (!) 145/100 (BP Location: Right Arm)   Pulse 94   Temp 97.7 F (36.5 C) (Temporal)   Resp 14   Ht 5\' 3"  (1.6 m)   Wt 78.5 kg   LMP 09/04/2012   SpO2 98%   BMI 30.65 kg/m   Final Clinical Impression(s) / ED Diagnoses Final diagnoses:  Irritant contact dermatitis, unspecified trigger    Rx / DC Orders ED Discharge Orders          Ordered    predniSONE (DELTASONE) 20 MG tablet        10/15/20 1350    diphenhydrAMINE (BENADRYL) 25 MG tablet  Every 6 hours PRN        10/15/20 1350           1:48 PM Patient here with localized skin irritation to her right forearm and left upper arm after working out in the yard.  Difficult to assess if this is reaction from insect bites versus contact dermatitis from poison ivy but due to persistent symptoms, will prescribe prednisone to help aid with healing.  This does not appear to be cellulitis or shingles.  No systemic manifestation.   12/15/20, PA-C 10/15/20 1351    Kommor, [place]ier [place], MD 10/15/20 [phone]

## 2020-10-15 NOTE — ED Triage Notes (Signed)
Pt reports multiple areas of redness and itching since Monday.  Small scattered areas of redness and swelling noted to pt's right arm in triage with one similar spot on left arm.

## 2020-10-15 NOTE — Discharge Instructions (Addendum)
Your skin rash could be due to reaction from insect bite or from contact to poison oak/poison ivy.  Take Benadryl as needed for itchiness, take prednisone as prescribed.  Return if you have any concern.  Make sure to trim your nails short to decrease risk of skin infection from scratching.

## 2021-04-25 NOTE — Progress Notes (Signed)
61 y.o. [id number] Married Black or [place] American Not Hispanic or Latino female here for annual exam.  No vaginal bleeding. Not sexually active secondary to ED.  ? ?No bowel c/o. ? ?She c/o frequent urination, normal amounts, no leakage.  ?  ? ?Patient's last menstrual period was 09/04/2012.          ?Sexually active: No.  ?The current method of family planning is post menopausal status.    ?Exercising: Yes.     Walking and home work outs  ?Smoker:  no ? ?Health Maintenance: ?Pap:  4/21/21WNL Hr HPV Neg,  2019 normal  ?History of abnormal Pap:  no ?MMG:  05/22/20 density B Bi-rads 1 neg  ?BMD:   none  ?Colonoscopy: 06/09/19 polyp f/u 5 years  ?TDaP:  04/27/19 ?Gardasil: n/a ? ? reports that she has quit smoking. She has never used smokeless tobacco. She reports current alcohol use. She reports that she does not use drugs. 1-2 glasses of wine a week. She works in Stage manager at [employer]. 2 grown daughters, Designer, multimedia, 5 grandchildren (oldest 12, youngest is 8). 3 sisters and 2 brothers. ? ?Past Medical History:  ?Diagnosis Date  ? Vitamin D deficiency   ? ? ?Past Surgical History:  ?Procedure Laterality Date  ? MOUTH SURGERY    ? ? ?Current Outpatient Medications  ?Medication Sig Dispense Refill  ? diphenhydrAMINE (BENADRYL) 25 MG tablet Take 1 tablet (25 mg total) by mouth every 6 (six) hours as needed. 30 tablet 0  ? hydrochlorothiazide (HYDRODIURIL) 12.5 MG tablet Take 12.5 mg by mouth daily.    ? ?No current facility-administered medications for this visit.  ? ? ?Family History  ?Problem Relation Age of Onset  ? Diabetes Mother   ? Heart failure Mother   ? Hypertension Mother   ? Asthma Sister   ? Hypertension Sister   ? Diabetes Brother   ? Hypertension Brother   ? Cancer Paternal Aunt   ? Diabetes Paternal Aunt   ? Diabetes Maternal Grandmother   ? Cancer Paternal Grandmother   ? Pseudochol deficiency Neg Hx   ? Malignant hyperthermia Neg Hx   ? Hypotension Neg Hx   ? Thyroid disease Neg Hx   ? Anesthesia problems Neg Hx    ? ? ?Review of Systems  ?All other systems reviewed and are negative. ? ?Exam:   ?BP 123/70   Pulse 63   Ht 5\' 3"  (1.6 m)   Wt 176 lb (79.8 kg)   LMP 09/04/2012   SpO2 100%   BMI 31.18 kg/m?   Weight change: @WEIGHTCHANGE @ Height:   Height: 5\' 3"  (160 cm)  ?Ht Readings from Last 3 Encounters:  ?05/03/21 5\' 3"  (1.6 m)  ?10/15/20 5\' 3"  (1.6 m)  ?04/27/20 5\' 3"  (1.6 m)  ? ? ?General appearance: alert, cooperative and appears stated age ?Head: Normocephalic, without obvious abnormality, atraumatic ?Neck: no adenopathy, supple, symmetrical, trachea midline and thyroid normal to inspection and palpation ?Lungs: clear to auscultation bilaterally ?Cardiovascular: regular rate and rhythm ?Breasts: normal appearance, no masses or tenderness ?Abdomen: soft, non-tender; non distended,  no masses,  no organomegaly ?Extremities: extremities normal, atraumatic, no cyanosis or edema ?Skin: Skin color, texture, turgor normal. No rashes or lesions ?Lymph nodes: Cervical, supraclavicular, and axillary nodes normal. ?No abnormal inguinal nodes palpated ?Neurologic: Grossly normal ? ? ?Pelvic: External genitalia:  no lesions ?             Urethra:  normal appearing urethra with no masses, tenderness or lesions ?  Bartholins and Skenes: normal    ?             Vagina: normal appearing vagina with normal color and discharge, no lesions ?             Cervix: no lesions ?              ?Bimanual Exam:  Uterus:   no masses or tenderness ?             Adnexa: no mass, fullness, tenderness ?              Rectovaginal: Confirms ?              Anus:  normal sphincter tone, no lesions ? ?[name] chaperoned for the exam. ? ?1. Well woman exam ?Pap is UTD ?Mammogram is due next month ?Colonoscopy is UTD ? ?2. Prediabetes ?- Hemoglobin A1c ? ?3. Vitamin D deficiency ?- VITAMIN D 25 Hydroxy (Vit-D Deficiency, Fractures) ? ?4. BMI 31.0-31.9,adult ?- Lipid panel ? ?5. Laboratory exam ordered as part of routine general medical  examination ?- CBC ?- Comprehensive metabolic panel ?- Lipid panel ? ? ?

## 2021-05-03 ENCOUNTER — Other Ambulatory Visit: Payer: Self-pay | Admitting: Internal Medicine

## 2021-05-03 ENCOUNTER — Encounter: Payer: Self-pay | Admitting: Obstetrics and Gynecology

## 2021-05-03 ENCOUNTER — Ambulatory Visit (INDEPENDENT_AMBULATORY_CARE_PROVIDER_SITE_OTHER): Payer: BC Managed Care – PPO | Admitting: Obstetrics and Gynecology

## 2021-05-03 VITALS — BP 123/70 | HR 63 | Ht 63.0 in | Wt 176.0 lb

## 2021-05-03 DIAGNOSIS — R7303 Prediabetes: Secondary | ICD-10-CM

## 2021-05-03 DIAGNOSIS — K5730 Diverticulosis of large intestine without perforation or abscess without bleeding: Secondary | ICD-10-CM | POA: Insufficient documentation

## 2021-05-03 DIAGNOSIS — Z6831 Body mass index (BMI) 31.0-31.9, adult: Secondary | ICD-10-CM | POA: Diagnosis not present

## 2021-05-03 DIAGNOSIS — Z01419 Encounter for gynecological examination (general) (routine) without abnormal findings: Secondary | ICD-10-CM | POA: Diagnosis not present

## 2021-05-03 DIAGNOSIS — Z860100 Personal history of colon polyps, unspecified: Secondary | ICD-10-CM | POA: Insufficient documentation

## 2021-05-03 DIAGNOSIS — Z1231 Encounter for screening mammogram for malignant neoplasm of breast: Secondary | ICD-10-CM

## 2021-05-03 DIAGNOSIS — R635 Abnormal weight gain: Secondary | ICD-10-CM | POA: Insufficient documentation

## 2021-05-03 DIAGNOSIS — E559 Vitamin D deficiency, unspecified: Secondary | ICD-10-CM

## 2021-05-03 DIAGNOSIS — Z0000 Encounter for general adult medical examination without abnormal findings: Secondary | ICD-10-CM

## 2021-05-03 DIAGNOSIS — Z1211 Encounter for screening for malignant neoplasm of colon: Secondary | ICD-10-CM | POA: Insufficient documentation

## 2021-05-03 NOTE — Patient Instructions (Signed)

## 2021-05-04 LAB — CBC
HCT: 42.7 % (ref 35.0–45.0)
Hemoglobin: 13.6 g/dL (ref 11.7–15.5)
MCH: 25.9 pg — ABNORMAL LOW (ref 27.0–33.0)
MCHC: 31.9 g/dL — ABNORMAL LOW (ref 32.0–36.0)
MCV: 81.3 fL (ref 80.0–100.0)
MPV: 11.8 fL (ref 7.5–12.5)
Platelets: 194 10*3/uL (ref 140–400)
RBC: 5.25 10*6/uL — ABNORMAL HIGH (ref 3.80–5.10)
RDW: 13.7 % (ref 11.0–15.0)
WBC: 3.7 10*3/uL — ABNORMAL LOW (ref 3.8–10.8)

## 2021-05-04 LAB — LIPID PANEL
Cholesterol: 166 mg/dL (ref <200)
HDL: 63 mg/dL (ref >=50)
LDL Cholesterol (Calc): 84 mg/dL (calc)
Non-HDL Cholesterol (Calc): 103 mg/dL (calc) (ref <130)
Total CHOL/HDL Ratio: 2.6 (calc) (ref <5.0)
Triglycerides: 91 mg/dL (ref <150)

## 2021-05-04 LAB — COMPREHENSIVE METABOLIC PANEL
AG Ratio: 1.6 (calc) (ref 1.0–2.5)
ALT: 12 U/L (ref 6–29)
AST: 12 U/L (ref 10–35)
Albumin: 4.2 g/dL (ref 3.6–5.1)
Alkaline phosphatase (APISO): 65 U/L (ref 37–153)
BUN: 14 mg/dL (ref 7–25)
CO2: 28 mmol/L (ref 20–32)
Calcium: 9.3 mg/dL (ref 8.6–10.4)
Chloride: 106 mmol/L (ref 98–110)
Creat: 0.8 mg/dL (ref 0.50–1.03)
Globulin: 2.7 g/dL (calc) (ref 1.9–3.7)
Glucose, Bld: 101 mg/dL — ABNORMAL HIGH (ref 65–99)
Potassium: 3.4 mmol/L — ABNORMAL LOW (ref 3.5–5.3)
Sodium: 143 mmol/L (ref 135–146)
Total Bilirubin: 0.6 mg/dL (ref 0.2–1.2)
Total Protein: 6.9 g/dL (ref 6.1–8.1)

## 2021-05-04 LAB — HEMOGLOBIN A1C
Hgb A1c MFr Bld: 5.9 % of total Hgb — ABNORMAL HIGH (ref <5.7)
Mean Plasma Glucose: 123 mg/dL
eAG (mmol/L): 6.8 mmol/L

## 2021-05-04 LAB — VITAMIN D 25 HYDROXY (VIT D DEFICIENCY, FRACTURES): Vit D, 25-Hydroxy: 37 ng/mL (ref 30–100)

## 2021-05-23 ENCOUNTER — Ambulatory Visit: Payer: BC Managed Care – PPO

## 2021-06-15 ENCOUNTER — Ambulatory Visit
Admission: RE | Admit: 2021-06-15 | Discharge: 2021-06-15 | Disposition: A | Discharge status: Home / Self Care | Payer: BC Managed Care – PPO | Source: Ambulatory Visit | Attending: Internal Medicine | Admitting: Internal Medicine

## 2021-06-15 DIAGNOSIS — Z1231 Encounter for screening mammogram for malignant neoplasm of breast: Secondary | ICD-10-CM

## 2022-05-07 NOTE — Progress Notes (Unsigned)
62 y.o. Cindy Wilson Married Black or [place] American Not Hispanic or Latino female here for annual exam.  No vaginal bleeding. Not sexually active secondary to ED.  No bowel or bladder c/o.     Patient's last menstrual period was 09/04/2012.          Sexually active: No.  The current method of family planning is post menopausal status.    Exercising: Yes.     Walk 3x a day Smoker: no  Health Maintenance: Pap: 04/28/2019-WNL, HPV- neg; 2019-WNL History of abnormal Pap: no MMG: 06/15/2021-neg birads 1 BMD: never Colonoscopy: 06/09/2019, Due 06/2024 TDaP: 04/27/2019 Gardasil: n/a   reports that she has quit smoking. She has never used smokeless tobacco. She reports current alcohol use. She reports that she does not use drugs. 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 3 sisters and 2 brothers.   Past Medical History:  Diagnosis Date   Hypertension    Vitamin D deficiency     Past Surgical History:  Procedure Laterality Date   MOUTH SURGERY      Current Outpatient Medications  Medication Sig Dispense Refill   cholecalciferol (VITAMIN D3) 25 MCG (1000 UNIT) tablet 1 tablet Orally Once a day for 30 day(s)     diphenhydrAMINE (BENADRYL) 25 MG tablet Take 1 tablet (25 mg total) by mouth every 6 (six) hours as needed. 30 tablet 0   hydrochlorothiazide (HYDRODIURIL) 12.5 MG tablet Take 12.5 mg by mouth daily.     No current facility-administered medications for this visit.    Family History  Problem Relation Age of Onset   Diabetes Mother    Heart failure Mother    Hypertension Mother    Asthma Sister    Hypertension Sister    Cancer Paternal Aunt    Diabetes Paternal Aunt    Diabetes Maternal Grandmother    Cancer Paternal Grandmother    Breast cancer Cousin        65s, maternal   Breast cancer Cousin        65s, maternal   Diabetes Brother    Hypertension Brother    Pseudochol deficiency Neg Hx    Malignant  hyperthermia Neg Hx    Hypotension Neg Hx    Thyroid disease Neg Hx    Anesthesia problems Neg Hx     Review of Systems  All other systems reviewed and are negative.   Exam:   BP 110/72   Pulse 97   Ht 5\' 3"  (1.6 m)   Wt 171 lb (77.6 kg)   LMP 09/04/2012   SpO2 95%   BMI 30.29 kg/m   Weight change: @WEIGHTCHANGE @ Height:   Height: 5\' 3"  (160 cm)  Ht Readings from Last 3 Encounters:  05/09/22 5\' 3"  (1.6 m)  05/03/21 5\' 3"  (1.6 m)  10/15/20 5\' 3"  (1.6 m)    General appearance: alert, cooperative and appears stated age Head: Normocephalic, without obvious abnormality, atraumatic Neck: no adenopathy, supple, symmetrical, trachea midline and thyroid normal to inspection and palpation Lungs: clear to auscultation bilaterally Cardiovascular: regular rate and rhythm Breasts: normal appearance, no masses or tenderness Abdomen: soft, non-tender; non distended,  no masses,  no organomegaly Extremities: extremities normal, atraumatic, no cyanosis or edema Skin: Skin color, texture, turgor normal. No rashes or lesions Lymph nodes: Cervical, supraclavicular, and axillary nodes normal. No abnormal inguinal nodes palpated Neurologic: Grossly normal   Pelvic: External genitalia:  no lesions  Urethra:  normal appearing urethra with no masses, tenderness or lesions              Bartholins and Skenes: normal                 Vagina: normal appearing vagina with normal color and discharge, no lesions              Cervix: no lesions               Bimanual Exam:  Uterus:  normal size, contour, position, consistency, mobility, non-tender              Adnexa: no mass, fullness, tenderness               Rectovaginal: Confirms               Anus:  normal sphincter tone, no lesions  Jodelle Red, CMA chaperoned for the exam.  1. Well woman exam Discussed breast self exam Discussed calcium and vit D intake Mammogram next month No pap this year Colonoscopy UTD Labs with  primary

## 2022-05-09 ENCOUNTER — Ambulatory Visit (INDEPENDENT_AMBULATORY_CARE_PROVIDER_SITE_OTHER): Payer: BC Managed Care – PPO | Admitting: Obstetrics and Gynecology

## 2022-05-09 ENCOUNTER — Encounter: Payer: Self-pay | Admitting: Obstetrics and Gynecology

## 2022-05-09 VITALS — BP 110/72 | HR 97 | Ht 63.0 in | Wt 171.0 lb

## 2022-05-09 DIAGNOSIS — I10 Essential (primary) hypertension: Secondary | ICD-10-CM | POA: Insufficient documentation

## 2022-05-09 DIAGNOSIS — Z01419 Encounter for gynecological examination (general) (routine) without abnormal findings: Secondary | ICD-10-CM | POA: Diagnosis not present

## 2022-05-09 NOTE — Patient Instructions (Signed)

## 2022-06-10 ENCOUNTER — Other Ambulatory Visit: Payer: Self-pay | Admitting: Internal Medicine

## 2022-06-10 DIAGNOSIS — Z1231 Encounter for screening mammogram for malignant neoplasm of breast: Secondary | ICD-10-CM

## 2022-07-02 ENCOUNTER — Ambulatory Visit
Admission: RE | Admit: 2022-07-02 | Discharge: 2022-07-02 | Disposition: A | Discharge status: Home / Self Care | Payer: BC Managed Care – PPO | Source: Ambulatory Visit | Attending: Internal Medicine | Admitting: Internal Medicine

## 2022-07-02 DIAGNOSIS — Z1231 Encounter for screening mammogram for malignant neoplasm of breast: Secondary | ICD-10-CM

## 2023-02-14 IMAGING — MG MM DIGITAL SCREENING BILAT W/ TOMO AND CAD
8 series · 8 of 24 positions shown · non-contrast
Comparison: Previous exam(s).

CLINICAL DATA: Screening.

EXAM:
DIGITAL SCREENING BILATERAL MAMMOGRAM WITH TOMOSYNTHESIS AND CAD
TECHNIQUE: Bilateral screening digital craniocaudal and mediolateral oblique
mammograms were obtained. Bilateral screening digital breast
tomosynthesis was performed. The images were evaluated with
computer-aided detection.

[L MLO synth-2D]
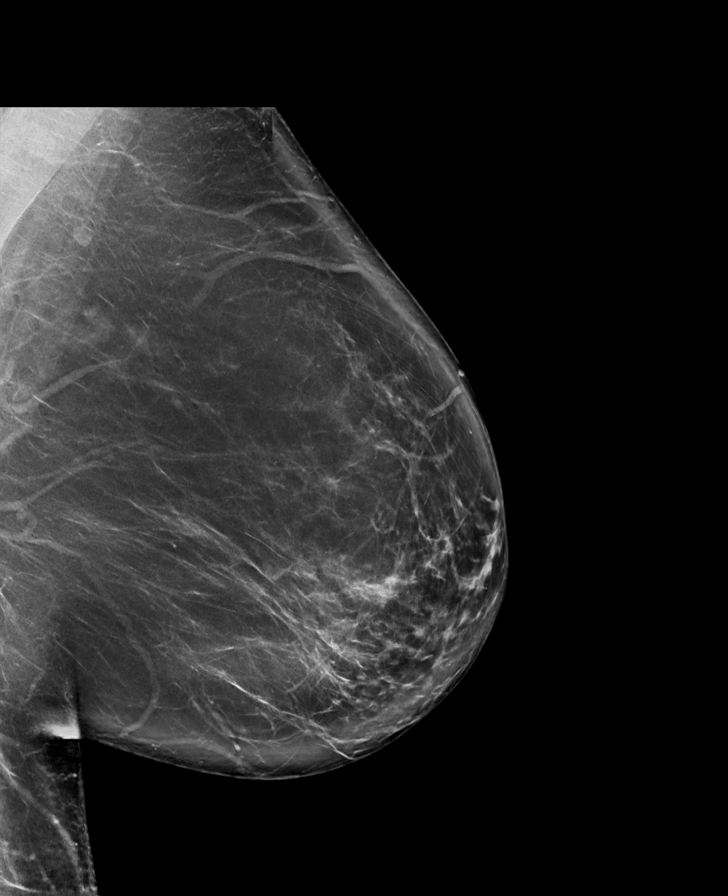

[R CC synth-2D]
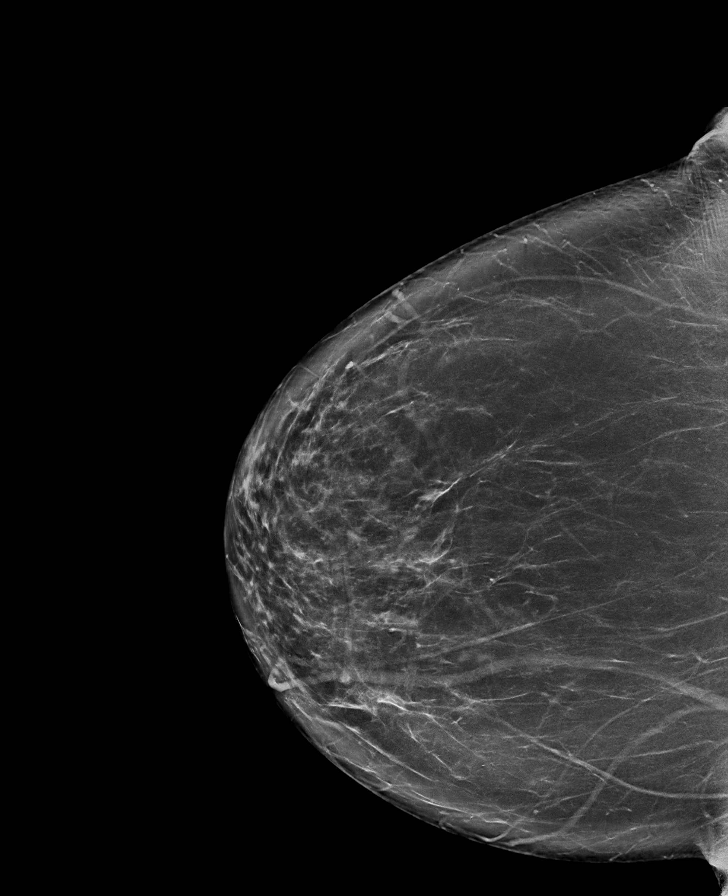

[R MLO synth-2D]
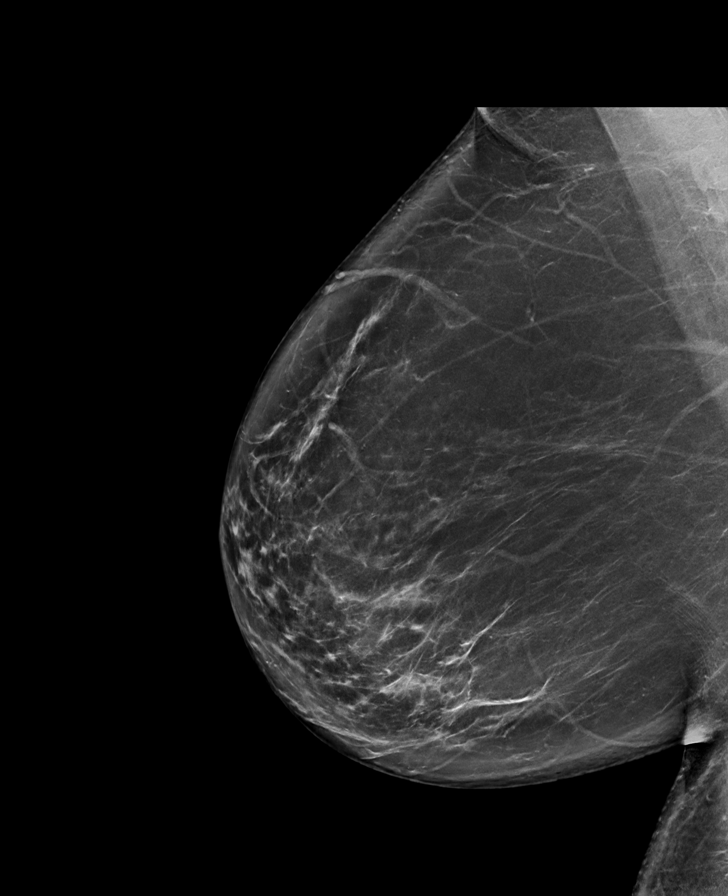

[L CC synth-2D]
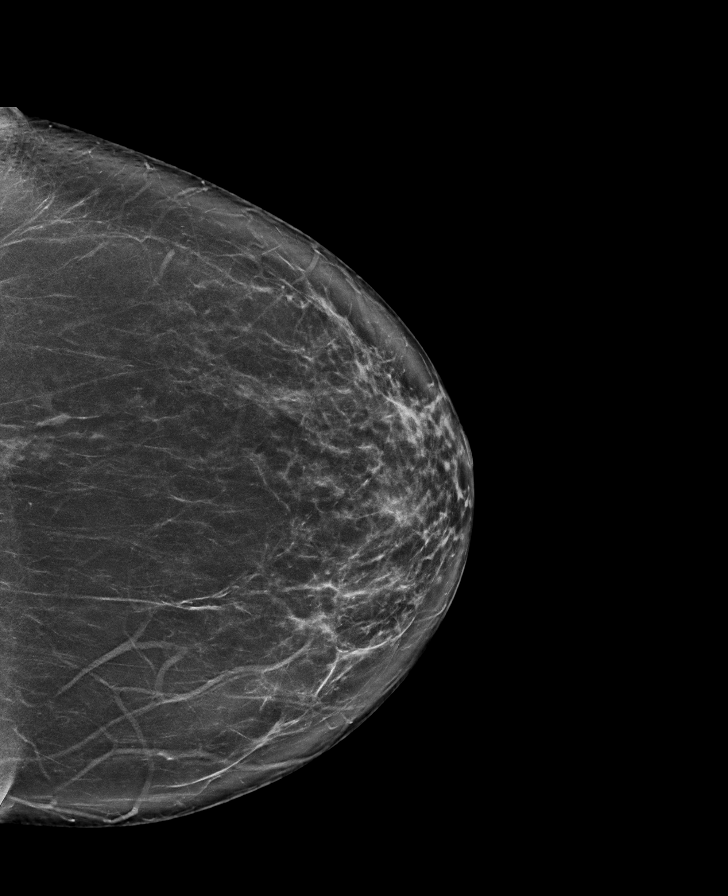

[L MLO tomo · tomo slice 53/105.0]
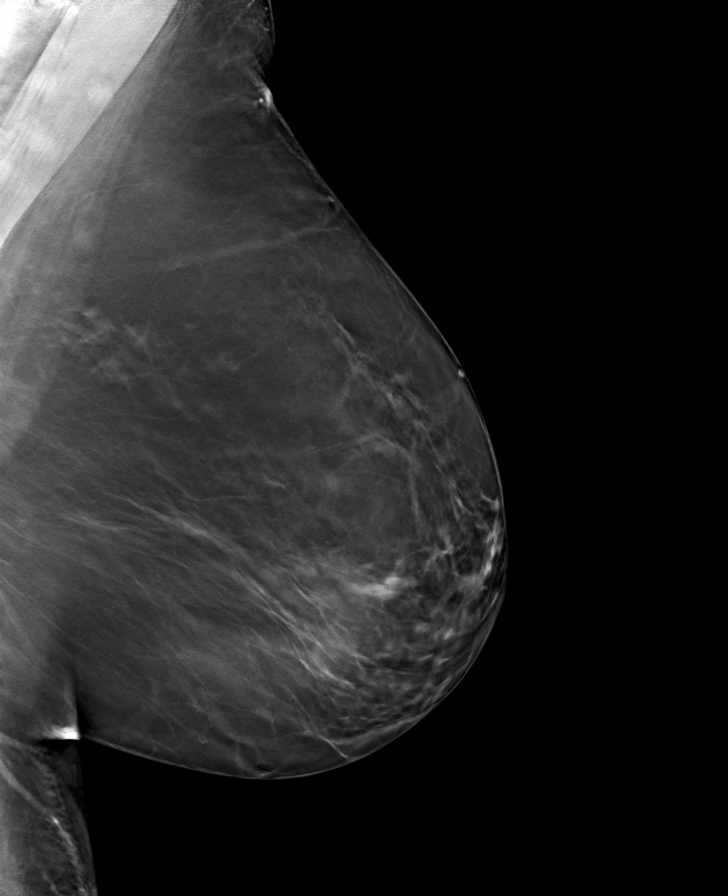

[R MLO tomo · tomo slice 51/100.0]
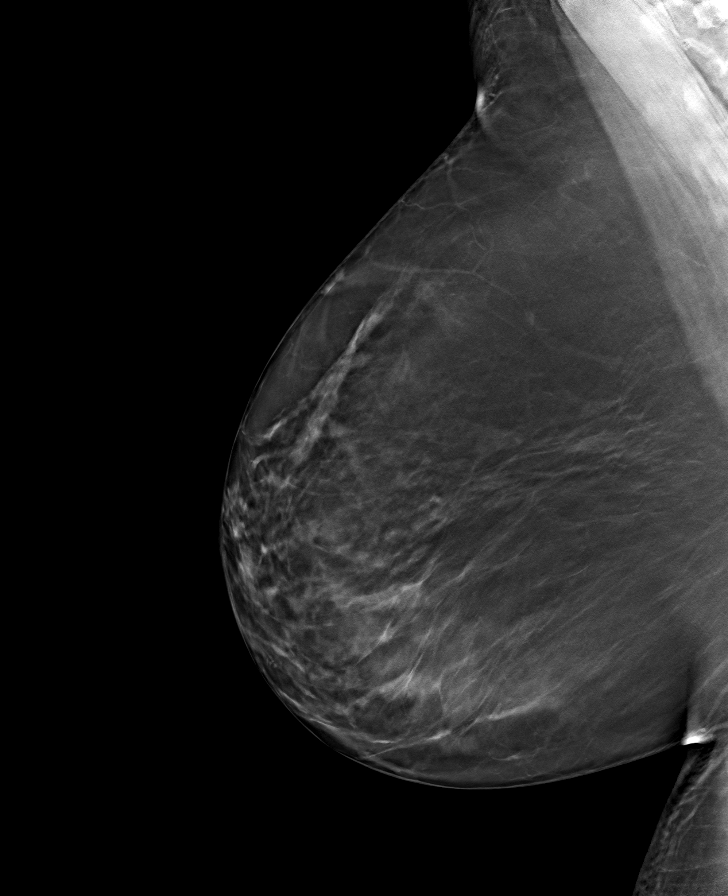

[L CC tomo · tomo slice 45/89.0]
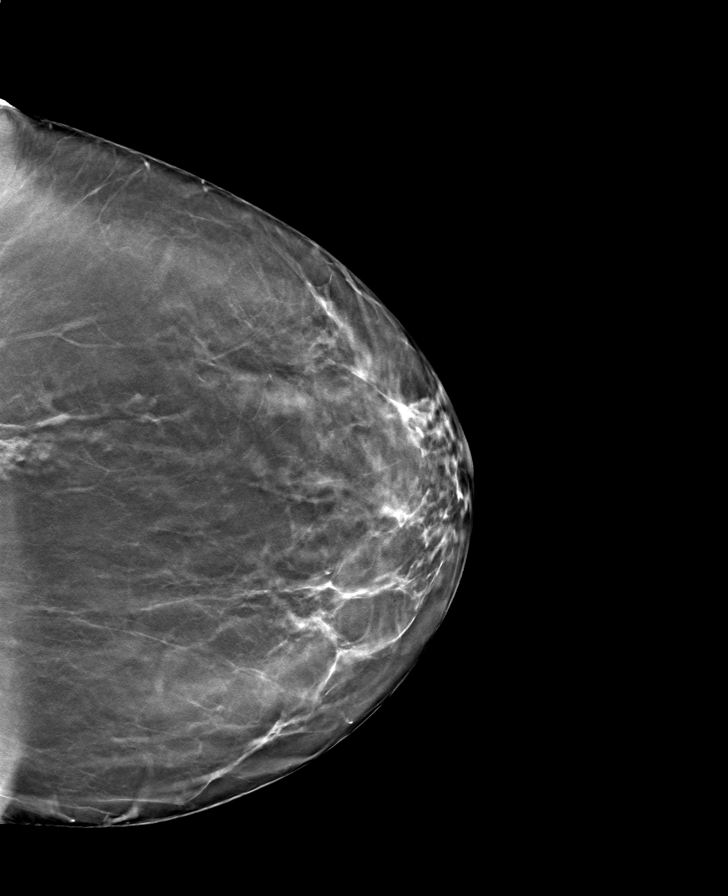

[R CC tomo · tomo slice 47/94.0]
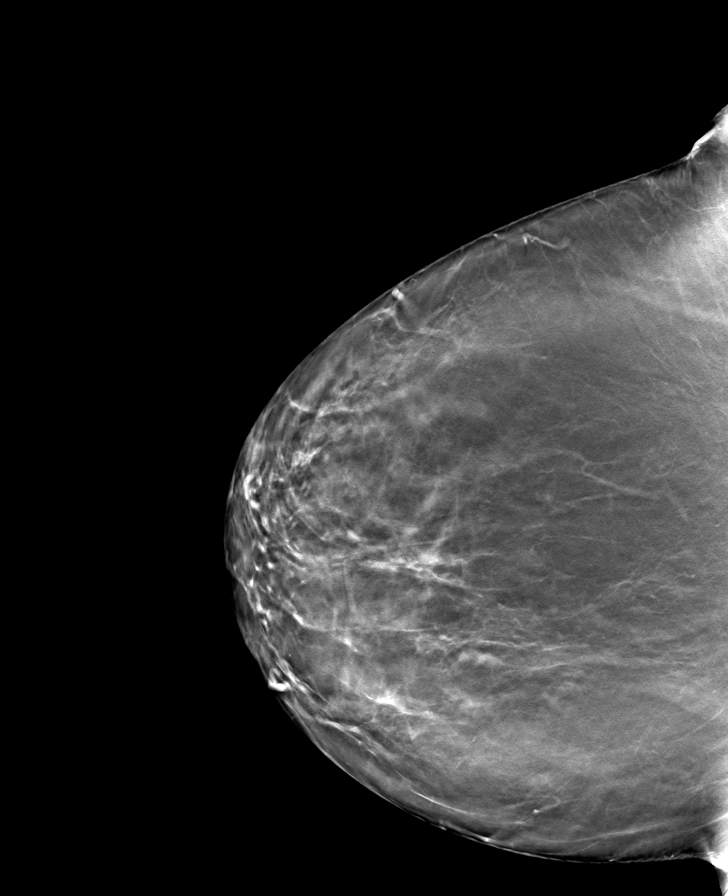

[8 of 24 positions shown; findings below may reference images not displayed]

ACR Breast Density Category b: There are scattered areas of
fibroglandular density.
FINDINGS: There are no findings suspicious for malignancy.
IMPRESSION: No mammographic evidence of malignancy. A result letter of this
screening mammogram will be mailed directly to the patient.

RECOMMENDATION:
Screening mammogram in one year. (Code:51-O-LD2)

BI-RADS CATEGORY  1: Negative.

## 2023-08-25 ENCOUNTER — Other Ambulatory Visit: Payer: Self-pay | Admitting: Internal Medicine

## 2023-08-25 DIAGNOSIS — Z1231 Encounter for screening mammogram for malignant neoplasm of breast: Secondary | ICD-10-CM

## 2023-09-18 ENCOUNTER — Ambulatory Visit
Admission: RE | Admit: 2023-09-18 | Discharge: 2023-09-18 | Disposition: A | Discharge status: Home / Self Care | Payer: Self-pay | Source: Ambulatory Visit | Attending: Internal Medicine | Admitting: Internal Medicine

## 2023-09-18 DIAGNOSIS — Z1231 Encounter for screening mammogram for malignant neoplasm of breast: Secondary | ICD-10-CM
